# Patient Record
Sex: Female | Born: 1970 | Marital: Married | Smoking: Current every day smoker
Health system: Southern US, Community
[De-identification: ages and names within clinical notes are randomized; demographics above are authoritative.]

## PROBLEM LIST (undated history)

## (undated) DIAGNOSIS — J449 Chronic obstructive pulmonary disease, unspecified: Secondary | ICD-10-CM

## (undated) HISTORY — PX: TUBAL LIGATION: SHX77

## (undated) HISTORY — DX: Chronic obstructive pulmonary disease, unspecified: J44.9

---

## 2011-09-29 ENCOUNTER — Other Ambulatory Visit: Payer: Self-pay | Admitting: Cardiology

## 2011-09-29 DIAGNOSIS — R079 Chest pain, unspecified: Secondary | ICD-10-CM

## 2011-09-30 DIAGNOSIS — R079 Chest pain, unspecified: Secondary | ICD-10-CM

## 2011-10-01 ENCOUNTER — Encounter: Payer: Self-pay | Admitting: Cardiology

## 2012-06-09 ENCOUNTER — Telehealth: Payer: Self-pay | Admitting: Nurse Practitioner

## 2012-06-09 NOTE — Telephone Encounter (Signed)
APPT MADE FOR 06/15/2012

## 2012-06-15 ENCOUNTER — Encounter: Payer: Self-pay | Admitting: Nurse Practitioner

## 2012-06-15 ENCOUNTER — Ambulatory Visit (INDEPENDENT_AMBULATORY_CARE_PROVIDER_SITE_OTHER): Payer: Medicaid Other | Admitting: Nurse Practitioner

## 2012-06-15 VITALS — BP 99/49 | HR 88 | Temp 98.0°F | Ht 64.0 in | Wt 112.0 lb

## 2012-06-15 DIAGNOSIS — L989 Disorder of the skin and subcutaneous tissue, unspecified: Secondary | ICD-10-CM

## 2012-06-15 NOTE — Progress Notes (Signed)
  Subjective:    Patient ID: Lindsey Reed, female    DOB: 25-Apr-1970, 42 y.o.   MRN: [id number]  HPI - Patient in C/O knot on her head and hair coming out in same area. Patient denies any injury. Knot is sore to touch and changes in size. No drainage.    Review of Systems  Constitutional: Negative.   HENT: Negative.   Eyes: Negative.   Respiratory: Negative.   Cardiovascular: Negative.   Skin: Negative.   Psychiatric/Behavioral: Negative.        Objective:   Physical Exam3 cm tender scalp lesion top of head just slightly to the left. Tender to touch with baldness.        Assessment & Plan:  Scalp lesion with baldness  Referral to dermatology  Do not pick at area [facility], FNP

## 2014-08-25 ENCOUNTER — Other Ambulatory Visit: Payer: Medicaid Other | Admitting: Nurse Practitioner

## 2014-09-01 ENCOUNTER — Encounter: Payer: Medicaid Other | Admitting: Physician Assistant

## 2014-09-04 ENCOUNTER — Encounter: Payer: Medicaid Other | Admitting: Physician Assistant

## 2014-09-11 ENCOUNTER — Encounter: Payer: Self-pay | Admitting: Nurse Practitioner

## 2014-09-18 ENCOUNTER — Encounter: Payer: Medicaid Other | Admitting: Physician Assistant

## 2014-09-25 ENCOUNTER — Encounter: Payer: Self-pay | Admitting: Nurse Practitioner

## 2016-04-24 ENCOUNTER — Ambulatory Visit: Payer: Medicaid Other | Admitting: Family

## 2016-04-25 ENCOUNTER — Encounter: Payer: Self-pay | Admitting: Family

## 2019-09-29 ENCOUNTER — Emergency Department (HOSPITAL_COMMUNITY)
Admission: EM | Admit: 2019-09-29 | Discharge: 2019-09-29 | Disposition: A | Discharge status: Home / Self Care | Payer: Self-pay | Attending: Emergency Medicine | Admitting: Emergency Medicine

## 2019-09-29 ENCOUNTER — Encounter (HOSPITAL_COMMUNITY): Payer: Self-pay | Admitting: Emergency Medicine

## 2019-09-29 ENCOUNTER — Other Ambulatory Visit: Payer: Self-pay

## 2019-09-29 ENCOUNTER — Emergency Department (HOSPITAL_COMMUNITY): Payer: Self-pay

## 2019-09-29 DIAGNOSIS — Z5321 Procedure and treatment not carried out due to patient leaving prior to being seen by health care provider: Secondary | ICD-10-CM | POA: Insufficient documentation

## 2019-09-29 DIAGNOSIS — H538 Other visual disturbances: Secondary | ICD-10-CM | POA: Insufficient documentation

## 2019-09-29 DIAGNOSIS — R42 Dizziness and giddiness: Secondary | ICD-10-CM | POA: Insufficient documentation

## 2019-09-29 DIAGNOSIS — R1110 Vomiting, unspecified: Secondary | ICD-10-CM | POA: Insufficient documentation

## 2019-09-29 LAB — COMPREHENSIVE METABOLIC PANEL
ALT: 14 U/L (ref 0–44)
AST: 21 U/L (ref 15–41)
Albumin: 4.2 g/dL (ref 3.5–5.0)
Alkaline Phosphatase: 90 U/L (ref 38–126)
Anion gap: 11 (ref 5–15)
BUN: 18 mg/dL (ref 6–20)
CO2: 27 mmol/L (ref 22–32)
Calcium: 9.4 mg/dL (ref 8.9–10.3)
Chloride: 101 mmol/L (ref 98–111)
Creatinine, Ser: 0.92 mg/dL (ref 0.44–1.00)
GFR calc Af Amer: >60 mL/min (ref >60)
GFR calc non Af Amer: >60 mL/min (ref >60)
Glucose, Bld: 103 mg/dL — ABNORMAL HIGH (ref 70–99)
Potassium: 3.2 mmol/L — ABNORMAL LOW (ref 3.5–5.1)
Sodium: 139 mmol/L (ref 135–145)
Total Bilirubin: 0.3 mg/dL (ref 0.3–1.2)
Total Protein: 7.8 g/dL (ref 6.5–8.1)

## 2019-09-29 LAB — CBC WITH DIFFERENTIAL/PLATELET
Abs Immature Granulocytes: 0.03 10*3/uL (ref 0.00–0.07)
Basophils Absolute: 0.1 10*3/uL (ref 0.0–0.1)
Basophils Relative: 1 %
Eosinophils Absolute: 0.2 10*3/uL (ref 0.0–0.5)
Eosinophils Relative: 2 %
HCT: 48.7 % — ABNORMAL HIGH (ref 36.0–46.0)
Hemoglobin: 15.5 g/dL — ABNORMAL HIGH (ref 12.0–15.0)
Immature Granulocytes: 0 %
Lymphocytes Relative: 20 %
Lymphs Abs: 1.5 10*3/uL (ref 0.7–4.0)
MCH: 30 pg (ref 26.0–34.0)
MCHC: 31.8 g/dL (ref 30.0–36.0)
MCV: 94.2 fL (ref 80.0–100.0)
Monocytes Absolute: 0.5 10*3/uL (ref 0.1–1.0)
Monocytes Relative: 7 %
Neutro Abs: 5.2 10*3/uL (ref 1.7–7.7)
Neutrophils Relative %: 70 %
Platelets: 413 10*3/uL — ABNORMAL HIGH (ref 150–400)
RBC: 5.17 MIL/uL — ABNORMAL HIGH (ref 3.87–5.11)
RDW: 14.4 % (ref 11.5–15.5)
WBC: 7.4 10*3/uL (ref 4.0–10.5)
nRBC: 0 % (ref 0.0–0.2)

## 2019-09-29 LAB — LIPASE, BLOOD: Lipase: 36 U/L (ref 11–51)

## 2019-09-29 NOTE — ED Notes (Signed)
Advised screeners she was leaving

## 2019-09-29 NOTE — ED Triage Notes (Signed)
Pt c/o dizziness and vomiting x 2 days. Denies hx of vertigo. States sometimes her vision feels blurry. Denies any other neuro symptoms.

## 2021-08-02 IMAGING — CT CT HEAD W/O CM
3 series · 16 of 47 positions shown, 19 images · non-contrast
Comparison: Report of prior head CT May 11, 2011 available;
images from that study cannot be retrieved at this time.

CLINICAL DATA: Dizziness with vomiting and altered vision

EXAM:
CT HEAD WITHOUT CONTRAST
TECHNIQUE: Contiguous axial images were obtained from the base of the skull
through the vertex without intravenous contrast.

[Series 2: head w o · axial · 0.43mm/px · z∈[+13,+138]mm · 10 of 30 slices shown, 13 images]
[im 3/30  brain]
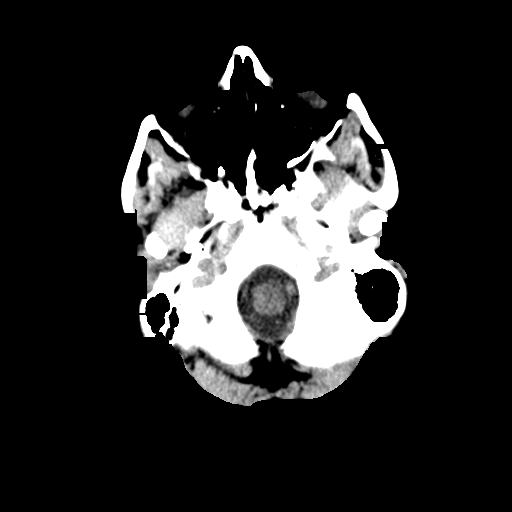
[im 3/30  bone]
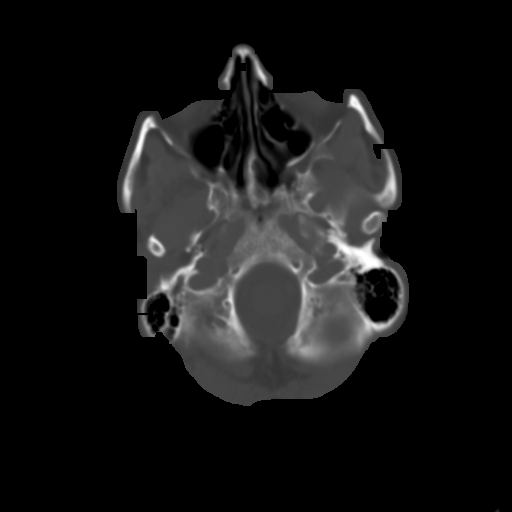
[im 6/30  brain]
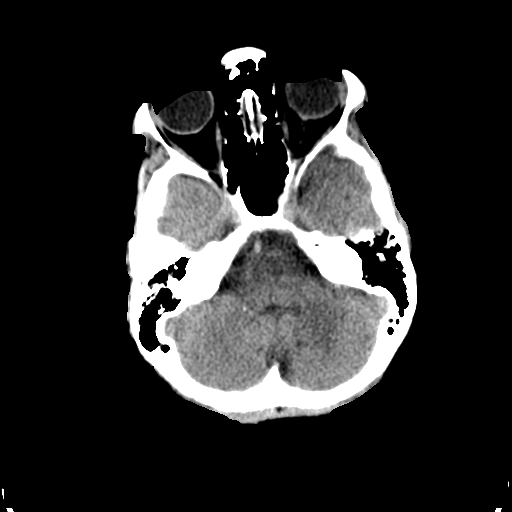
[im 9/30  brain]
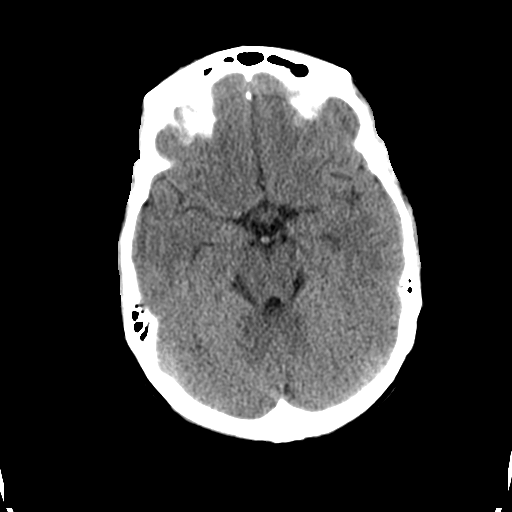
[im 11/30  brain]
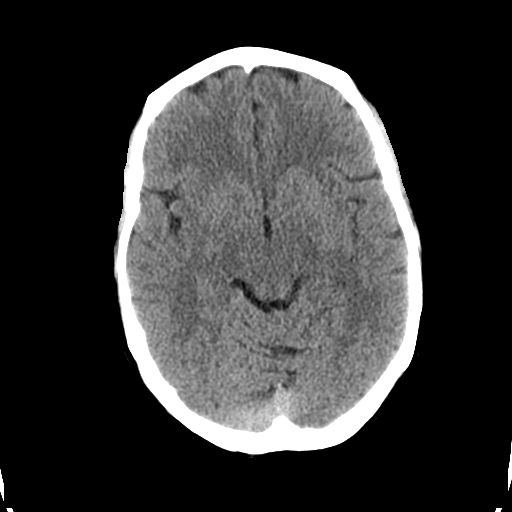
[im 14/30  brain]
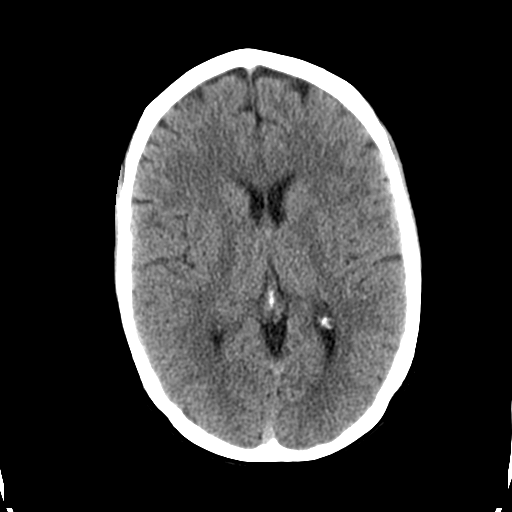
[im 14/30  bone]
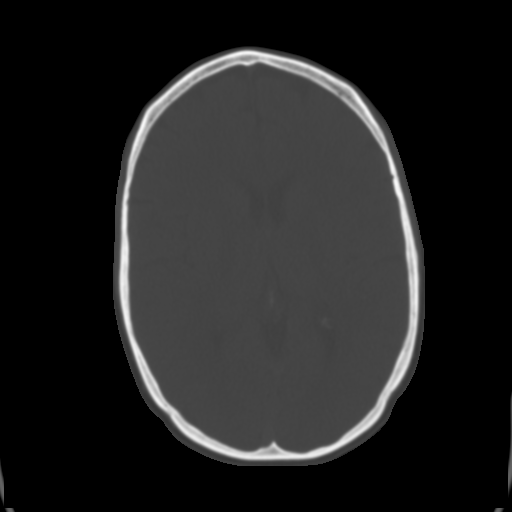
[im 17/30  brain]
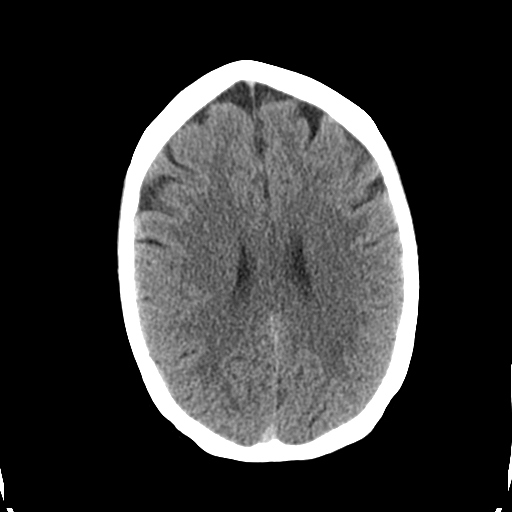
[im 20/30  brain]
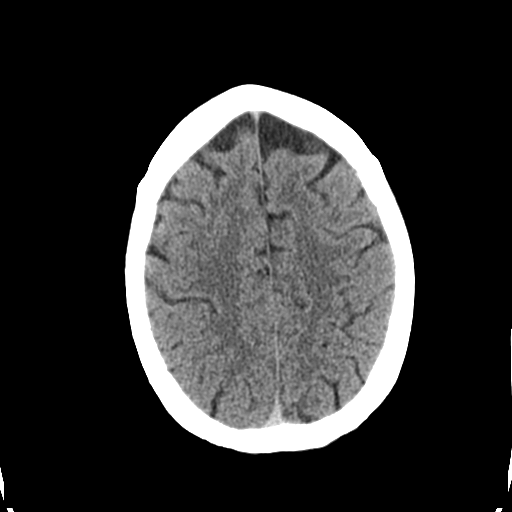
[im 23/30  brain]
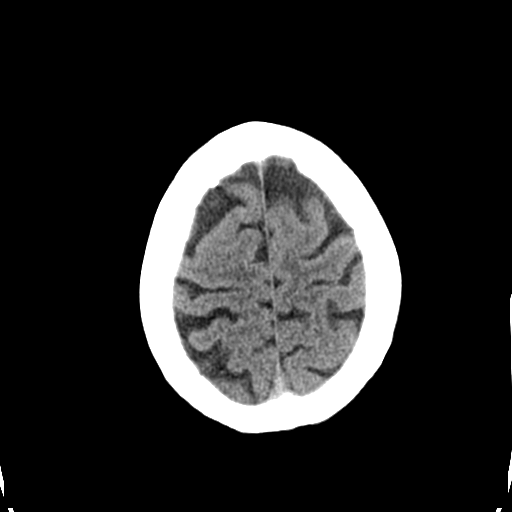
[im 25/30  brain]
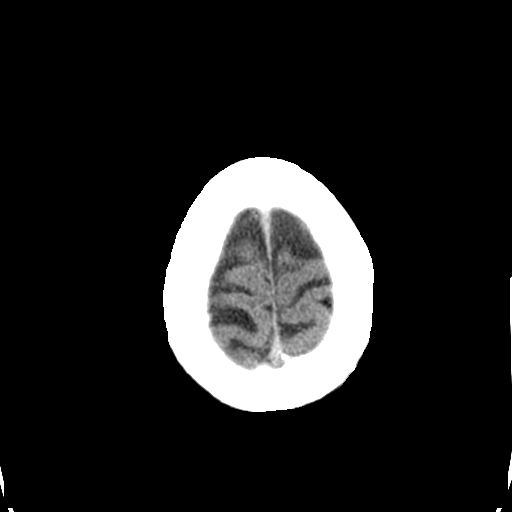
[im 25/30  bone]
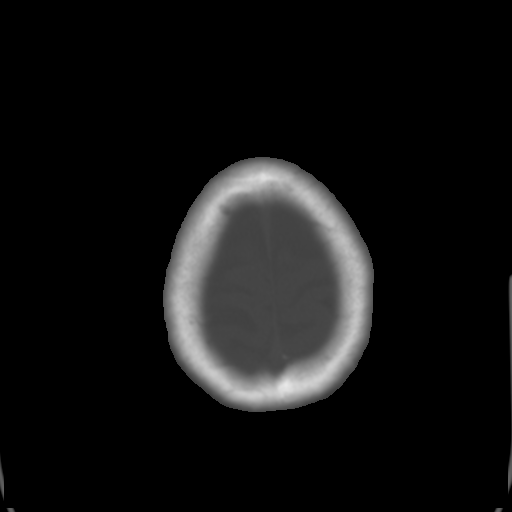
[im 28/30  brain]
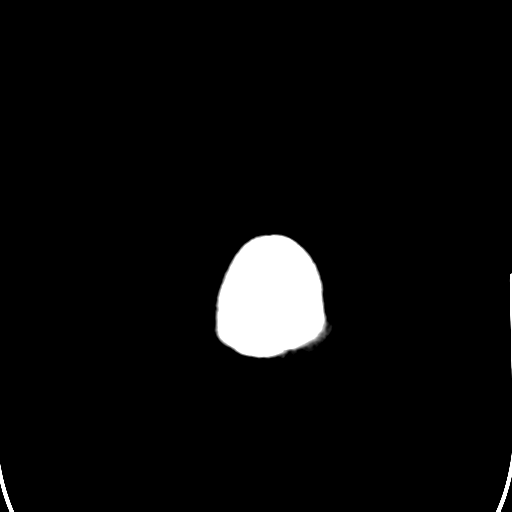

[Series 4: coronal soft · coronal · 0.30mm/px · 3 of 67 slices shown]
[im 23/67  brain]
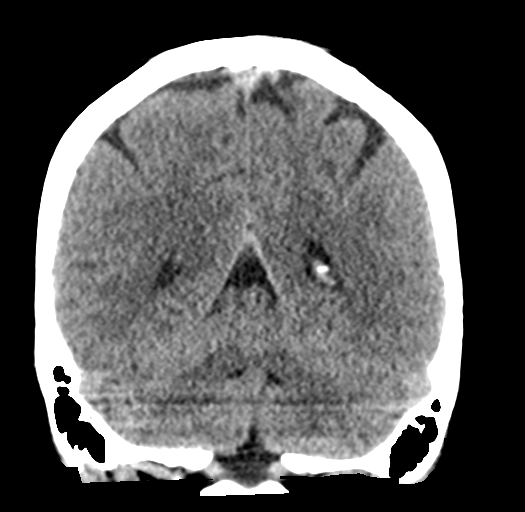
[im 30/67  brain]
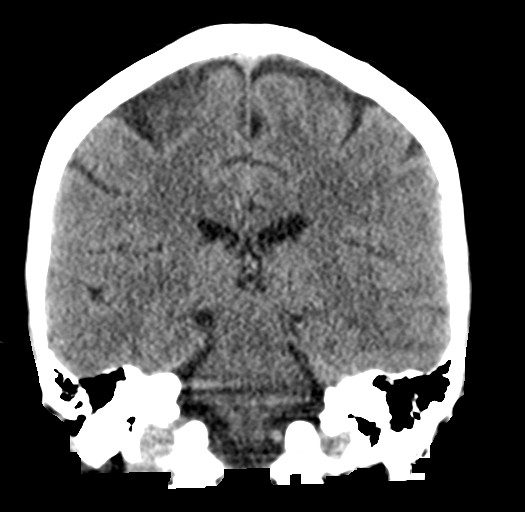
[im 37/67  brain]
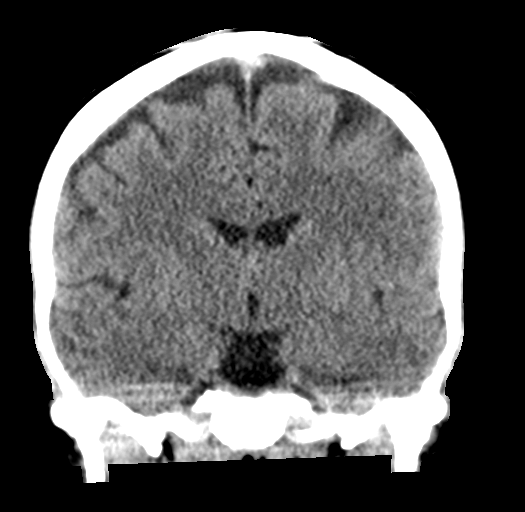

[Series 5: sagittal soft · sagittal · 0.32mm/px · 3 of 51 slices shown]
[im 17/51  brain]
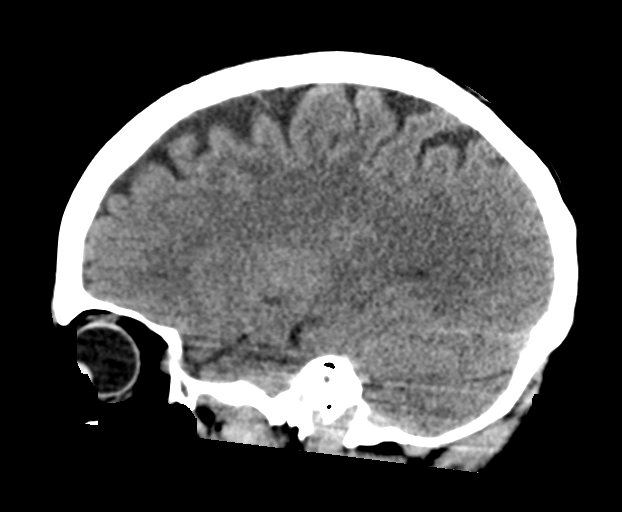
[im 26/51  brain]
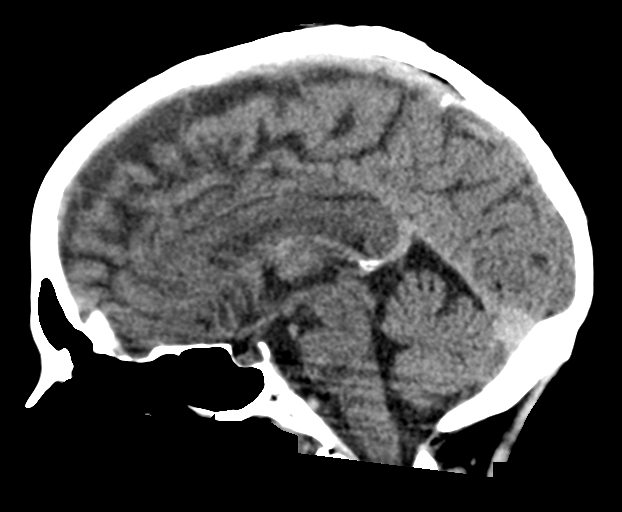
[im 34/51  brain]
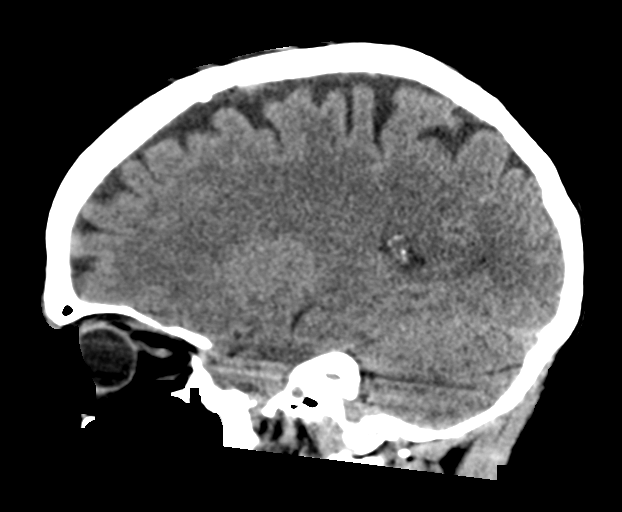

[16 of 47 positions shown; findings below may reference images not displayed]

FINDINGS: Brain: Ventricles and sulci are normal in size and configuration.
There is no intracranial mass, hemorrhage, extra-axial fluid
collection, or midline shift. The brain parenchyma appears
unremarkable. No evident acute infarct.

Vascular: No hyperdense vessel. No appreciable vascular
calcification.

Skull: The bony calvarium appears intact.

Sinuses/Orbits: Visualized paranasal sinuses are clear. Orbits
appear symmetric bilaterally.

Other: Visualized mastoid air cells are clear.
IMPRESSION: Study within normal limits.
